# Patient Record
Sex: Female | Born: 2005
Health system: Southern US, Community
[De-identification: ages and names within clinical notes are randomized; demographics above are authoritative.]

---
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
## 2024-02-26 ENCOUNTER — Ambulatory Visit: Admitting: Physician Assistant

## 2024-04-05 ENCOUNTER — Ambulatory Visit (INDEPENDENT_AMBULATORY_CARE_PROVIDER_SITE_OTHER): Admitting: Family

## 2024-04-05 ENCOUNTER — Encounter: Payer: Self-pay | Admitting: Family

## 2024-04-05 VITALS — BP 110/60 | HR 80 | Temp 97.6°F | Ht 65.0 in | Wt 148.6 lb

## 2024-04-05 DIAGNOSIS — Z1159 Encounter for screening for other viral diseases: Secondary | ICD-10-CM | POA: Diagnosis not present

## 2024-04-05 DIAGNOSIS — Z0000 Encounter for general adult medical examination without abnormal findings: Secondary | ICD-10-CM

## 2024-04-05 LAB — LIPID PANEL
Cholesterol: 196 mg/dL (ref 0–200)
HDL: 58.3 mg/dL (ref >39.00)
LDL Cholesterol: 127 mg/dL — ABNORMAL HIGH (ref 0–99)
NonHDL: 137.62
Total CHOL/HDL Ratio: 3
Triglycerides: 53 mg/dL (ref 0.0–149.0)
VLDL: 10.6 mg/dL (ref 0.0–40.0)

## 2024-04-05 LAB — CBC WITH DIFFERENTIAL/PLATELET
Basophils Absolute: 0 10*3/uL (ref 0.0–0.1)
Basophils Relative: 0.4 % (ref 0.0–3.0)
Eosinophils Absolute: 0 10*3/uL (ref 0.0–0.7)
Eosinophils Relative: 0.7 % (ref 0.0–5.0)
HCT: 40.2 % (ref 36.0–46.0)
Hemoglobin: 13.5 g/dL (ref 12.0–15.0)
Lymphocytes Relative: 22.4 % (ref 12.0–46.0)
Lymphs Abs: 1.3 10*3/uL (ref 0.7–4.0)
MCHC: 33.5 g/dL (ref 30.0–36.0)
MCV: 87 fl (ref 78.0–100.0)
Monocytes Absolute: 0.5 10*3/uL (ref 0.1–1.0)
Monocytes Relative: 9 % (ref 3.0–12.0)
Neutro Abs: 3.9 10*3/uL (ref 1.4–7.7)
Neutrophils Relative %: 67.5 % (ref 43.0–77.0)
Platelets: 308 10*3/uL (ref 150.0–400.0)
RBC: 4.62 Mil/uL (ref 3.87–5.11)
RDW: 14.8 % — ABNORMAL HIGH (ref 11.5–14.6)
WBC: 5.7 10*3/uL (ref 4.5–10.5)

## 2024-04-05 LAB — COMPREHENSIVE METABOLIC PANEL WITH GFR
ALT: 11 U/L (ref 0–35)
AST: 16 U/L (ref 0–37)
Albumin: 4.4 g/dL (ref 3.5–5.2)
Alkaline Phosphatase: 58 U/L (ref 39–117)
BUN: 13 mg/dL (ref 6–23)
CO2: 28 meq/L (ref 19–32)
Calcium: 9.2 mg/dL (ref 8.4–10.5)
Chloride: 102 meq/L (ref 96–112)
Creatinine, Ser: 0.6 mg/dL (ref 0.40–1.20)
GFR: 129.39 mL/min (ref >60.00)
Glucose, Bld: 89 mg/dL (ref 70–99)
Potassium: 3.8 meq/L (ref 3.5–5.1)
Sodium: 138 meq/L (ref 135–145)
Total Bilirubin: 0.4 mg/dL (ref 0.2–1.2)
Total Protein: 7.7 g/dL (ref 6.0–8.3)

## 2024-04-05 LAB — TSH: TSH: 1.01 u[IU]/mL (ref 0.35–5.50)

## 2024-04-05 NOTE — Progress Notes (Signed)
 Phone [phone]  Subjective:   Patient is a 18 y.o. female presenting for annual physical.    Chief Complaint  Patient presents with   Annual Exam    Fasting  Discussed the use of AI scribe software for clinical note transcription with the patient, who gave verbal consent to proceed.  History of Present Illness Diana Suarez is a 18 year old female who presents to establish care and for an annual physical exam.  She experiences lightheadedness and slight nausea during intense cardio workouts, preferring exercises that allow her to sit or have wall support. Heat exacerbates her symptoms, and she has been less consistent with gym workouts over the summer. Her mother had similar issues growing up, often fainting during physical activity.  She feels bloated, particularly at night and sometimes in the morning, with an 'airy' sensation. She has tried over-the-counter Gas X for relief. Certain foods and tight clothing can contribute to her gas and bloating.  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 She exercises regularly, focusing on weights and light cardio, and maintains a healthy lifestyle with minimal alcohol consumption.  See problem oriented charting- ROS- full  review of systems was completed and negative except for what is noted in HPI above.  The following were reviewed and entered/updated in epic: History reviewed. No pertinent past medical history. There are no active problems to display for this patient.  History reviewed. No pertinent family history.  Medications- reviewed and updated No current outpatient medications on file.   No current facility-administered medications for this visit.   Allergies-reviewed and updated Not on File  Social History   Social History Narrative   Not on file    Objective:  BP 110/60   Pulse 80   Temp 97.6 F (36.4 C)   Ht 5' 5 (1.651 m)   Wt 148 lb 9.6 oz (67.4 kg)   SpO2 99%    BMI 24.73 kg/m  Physical Exam Vitals and nursing note reviewed.  Constitutional:      Appearance: Normal appearance.  HENT:     Head: Normocephalic.     Right Ear: Tympanic membrane normal.     Left Ear: Tympanic membrane normal.     Nose: Nose normal.     Mouth/Throat:     Mouth: Mucous membranes are moist.   Eyes:     Pupils: Pupils are equal, round, and reactive to light.    Cardiovascular:     Rate and Rhythm: Normal rate and regular rhythm.  Pulmonary:     Effort: Pulmonary effort is normal.     Breath sounds: Normal breath sounds.   Musculoskeletal:        General: Normal range of motion.     Cervical back: Normal range of motion.  Lymphadenopathy:     Cervical: No cervical adenopathy.   Skin:    General: Skin is warm and dry.   Neurological:     Mental Status: She is alert.   Psychiatric:        Mood and Affect: Mood normal.        Behavior: Behavior normal.     Assessment and Plan   Health Maintenance counseling: 1. Anticipatory guidance: Patient counseled regarding regular dental exams q6 months, eye exams,  avoiding smoking and second hand smoke, limiting alcohol to 1 beverage per day, no illicit drugs.   2. Risk factor reduction:  Advised patient of need for regular exercise and diet rich with  fruits and vegetables to reduce risk of heart attack and stroke. 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 Wt Readings from Last 3 Encounters:  04/05/24 148 lb 9.6 oz (67.4 kg)   3. Immunizations/screenings/ancillary studies Immunization History  Administered Date(s) Administered   DTaP / Hep B / IPV 03/19/2004, 05/18/2004, 07/16/2004   DTaP / IPV 01/28/2008   Dtap, Unspecified 04/19/2005   HIB, Unspecified 03/19/2004, 05/18/2004, 07/16/2004, 01/17/2005   HPV 9-valent 11/01/2017, 04/04/2018   Hep A, Unspecified 07/19/2005, 01/17/2006   Hep B, Unspecified 01/18/04   Influenza Nasal 08/30/2007, 08/30/2010   Influenza-Unspecified 11/01/2004    MMR 01/17/2005, 01/28/2008   Meningococcal B, OMV 03/31/2020, 03/01/2021   Meningococcal Conjugate 04/18/2016, 03/31/2020   PFIZER(Purple Top)SARS-COV-2 Vaccination 02/12/2020, 03/10/2020   Pneumococcal Conjugate PCV 7 03/19/2004, 05/18/2004, 07/16/2004, 01/17/2005   Tdap 04/18/2016   Varicella 04/19/2005, 01/28/2008   Health Maintenance Due  Topic Date Due   HIV Screening  Never done   Hepatitis C Screening  Never done    4. Cervical cancer screening: never sexually active 5. Skin cancer screening- advised regular sunscreen use. Denies worrisome, changing, or new skin lesions.  6. Birth control/STD check: none 7. Smoking associated screening: never smoked 8. Alcohol screening: rare  Assessment & Plan Exercise-induced lightheadedness Lightheadedness and nausea during intense cardio likely due to rapid heart rate increase, exacerbated by heat. - Advise monitoring heart rate during exercise to maintain 120-140 bpm. - Recommend gradual increase in exercise intensity. - Advise against sustaining high heart rates for extended periods. - Suggest using a heart rate monitor or manually checking pulse during exercise. - Recommend reducing exercise intensity if lightheadedness occurs.  Bloating Bloating, particularly at night, likely due to gas from dietary causes or menstrual cycle. - Advise avoiding gas-causing foods such as cabbage, onions, broccoli, and dairy. - Recommend OTC simethicone (Gas X) for relief if bloating becomes uncomfortable. - Suggest avoiding tight clothing that may contribute to gas. - Advise minimizing amount of drinking through a straw to reduce air intake. - Encourage regular exercise to help alleviate bloating.  General Health Maintenance Routine physical examination for a 18 year old female with healthy BMI, minimal alcohol consumption, and regular menstrual cycles. - Perform routine CPE blood work. - Advise minimal alcohol consumption due to caloric content,  mood-altering effects and under age status. - Encourage regular exercise and a healthy lifestyle.   Recommended follow up:  Return for any future concerns, Complete physical w/fasting labs. No future appointments.  Lab/Order associations:  fasting   Lucius Krabbe, NP

## 2024-04-05 NOTE — Patient Instructions (Signed)
 Welcome to [facility], It was a pleasure meeting you today!    I will review your lab results via MyChart in a few days.  Have a great summer!    PLEASE NOTE: If you had any LAB tests please let us  know if you have not heard back within a few days. You may see your results on MyChart before we have a chance to review them but we will give you a call once they are reviewed by us . If we ordered any REFERRALS today, please let us  know if you have not heard from their office within the next week.  Let us  know through MyChart if you are needing REFILLS, or have your pharmacy send us  the request. You can also use MyChart to communicate with me or any office staff.  Please try these tips to maintain a healthy lifestyle: It is important that you exercise regularly at least 30 minutes 5 times a week. Think about what you will eat, plan ahead. Choose whole foods, & think  clean, green, fresh or frozen over canned, processed or packaged foods which are more sugary, salty, and fatty. 70 to 75% of food eaten should be fresh vegetables and protein. 2-3  meals daily with healthy snacks between meals, but must be whole fruit, protein or vegetables. Aim to eat over a 10 hour period when you are active, for example, 7am to 5pm, and then STOP after your last meal of the day, drinking only water.  Shorter eating windows, 6-8 hours, are showing benefits in heart disease and blood sugar regulation. Drink water every day! Shoot for 64 ounces daily = 8 cups, no other drink is as healthy! Fruit juice is best enjoyed in a healthy way, by EATING the fruit.

## 2024-04-06 LAB — HIV ANTIBODY (ROUTINE TESTING W REFLEX): HIV 1&2 Ab, 4th Generation: NONREACTIVE

## 2024-04-06 LAB — HEPATITIS C ANTIBODY: Hepatitis C Ab: NONREACTIVE

## 2024-04-10 ENCOUNTER — Ambulatory Visit: Payer: Self-pay | Admitting: Family

## 2024-04-10 NOTE — Progress Notes (Signed)
 Normal labs - kidney, liver, thyroid, blood count all good, HIV & Hep C are neg. Cholesterol numbers good, just bad # (LDL) is a little elevated - reduce fried foods, and high fat dairy, no processed foods inc. pies, cakes, cookies, etc. Thx
